# Patient Record
Sex: Male | Born: 1985 | Race: White | Hispanic: No | Marital: Married | Smoking: Never smoker
Health system: Southern US, Community
[De-identification: ages and names within clinical notes are randomized; demographics above are authoritative.]

## PROBLEM LIST (undated history)

## (undated) HISTORY — PX: NO PAST SURGERIES: SHX2092

---

## 2018-03-10 ENCOUNTER — Encounter: Payer: Self-pay | Admitting: Emergency Medicine

## 2018-03-10 ENCOUNTER — Emergency Department

## 2018-03-10 ENCOUNTER — Emergency Department
Admission: EM | Admit: 2018-03-10 | Discharge: 2018-03-10 | Disposition: A | Discharge status: Home / Self Care | Attending: Emergency Medicine | Admitting: Emergency Medicine

## 2018-03-10 ENCOUNTER — Other Ambulatory Visit: Payer: Self-pay

## 2018-03-10 DIAGNOSIS — Y929 Unspecified place or not applicable: Secondary | ICD-10-CM | POA: Diagnosis not present

## 2018-03-10 DIAGNOSIS — Y9367 Activity, basketball: Secondary | ICD-10-CM | POA: Insufficient documentation

## 2018-03-10 DIAGNOSIS — S0990XA Unspecified injury of head, initial encounter: Secondary | ICD-10-CM | POA: Diagnosis present

## 2018-03-10 DIAGNOSIS — S59911A Unspecified injury of right forearm, initial encounter: Secondary | ICD-10-CM | POA: Insufficient documentation

## 2018-03-10 DIAGNOSIS — Y999 Unspecified external cause status: Secondary | ICD-10-CM | POA: Insufficient documentation

## 2018-03-10 DIAGNOSIS — S02612A Fracture of condylar process of left mandible, initial encounter for closed fracture: Secondary | ICD-10-CM | POA: Diagnosis not present

## 2018-03-10 DIAGNOSIS — W01198A Fall on same level from slipping, tripping and stumbling with subsequent striking against other object, initial encounter: Secondary | ICD-10-CM | POA: Insufficient documentation

## 2018-03-10 DIAGNOSIS — S0181XA Laceration without foreign body of other part of head, initial encounter: Secondary | ICD-10-CM | POA: Diagnosis not present

## 2018-03-10 DIAGNOSIS — S0219XA Other fracture of base of skull, initial encounter for closed fracture: Secondary | ICD-10-CM | POA: Diagnosis not present

## 2018-03-10 DIAGNOSIS — S02610A Fracture of condylar process of mandible, unspecified side, initial encounter for closed fracture: Secondary | ICD-10-CM

## 2018-03-10 DIAGNOSIS — W19XXXA Unspecified fall, initial encounter: Secondary | ICD-10-CM

## 2018-03-10 DIAGNOSIS — S59919A Unspecified injury of unspecified forearm, initial encounter: Secondary | ICD-10-CM

## 2018-03-10 MED ORDER — OXYCODONE-ACETAMINOPHEN 7.5-325 MG PO TABS: 1.0000 | ORAL_TABLET | ORAL | 0 refills | Status: AC | PRN

## 2018-03-10 MED ORDER — CIPROFLOXACIN-DEXAMETHASONE 0.3-0.1 % OT SUSP: 4.0000 [drp] | Freq: Two times a day (BID) | OTIC | 0 refills | Status: DC

## 2018-03-10 MED ORDER — CIPROFLOXACIN-DEXAMETHASONE 0.3-0.1 % OT SUSP
4.0000 [drp] | Freq: Once | OTIC | Status: DC
  Filled 2018-03-10: qty 7.5

## 2018-03-10 MED ORDER — LIDOCAINE-EPINEPHRINE 1 %-1:100000 IJ SOLN
10.0000 mL | Freq: Once | INTRAMUSCULAR | Status: AC
  Administered 2018-03-10: 10 mL via INTRADERMAL
  Filled 2018-03-10 (×2): qty 10

## 2018-03-10 MED ORDER — ONDANSETRON HCL 4 MG/2ML IJ SOLN
4.0000 mg | Freq: Once | INTRAMUSCULAR | Status: AC
  Administered 2018-03-10: 4 mg via INTRAVENOUS
  Filled 2018-03-10: qty 2

## 2018-03-10 MED ORDER — HYDROMORPHONE HCL 1 MG/ML IJ SOLN
0.5000 mg | Freq: Once | INTRAMUSCULAR | Status: AC
  Administered 2018-03-10: 0.5 mg via INTRAVENOUS
  Filled 2018-03-10: qty 1

## 2018-03-10 MED ORDER — SODIUM CHLORIDE 0.9 % IV SOLN
Freq: Once | INTRAVENOUS | Status: AC
  Administered 2018-03-10: 13:00:00 via INTRAVENOUS

## 2018-03-10 MED ORDER — MORPHINE SULFATE (PF) 4 MG/ML IV SOLN
4.0000 mg | Freq: Once | INTRAVENOUS | Status: AC
  Administered 2018-03-10: 4 mg via INTRAVENOUS
  Filled 2018-03-10: qty 1

## 2018-03-10 NOTE — ED Provider Notes (Signed)
[facility] Emergency Department Provider Note       Time seen: ----------------------------------------- 11:44 AM on 03/10/2018 -----------------------------------------   I have reviewed the triage vital signs and the nursing notes.  HISTORY   Chief Complaint No chief complaint on file.    HPI Timothy Kelley is a 32 y.o. male with no significant past medical history who presents to the ED for a fall.  Patient states he was playing basketball and he fell hitting his chin and left side of his face.  According to EMS he had a large quantity of blood coming from his left ear which has slowed down now.  Is also complaining of right wrist pain and chin pain with a laceration on his chin.  He denies loss of consciousness or other complaints at this time.  He denies any decreased hearing in his left ear.  No past medical history on file.  There are no active problems to display for this patient.   Allergies Patient has no allergy information on record.  Social History Social History   Tobacco Use  . Smoking status: Not on file  Substance Use Topics  . Alcohol use: Not on file  . Drug use: Not on file   Review of Systems Constitutional: Negative for fever. ENT: Positive for jaw pain and bleeding from the left ear Cardiovascular: Negative for chest pain. Respiratory: Negative for shortness of breath. Gastrointestinal: Negative for abdominal pain, vomiting and diarrhea. Musculoskeletal: Positive for right forearm and wrist pain Skin: Positive for chin laceration Neurological: Negative for headaches, focal weakness or numbness.  All systems negative/normal/unremarkable except as stated in the HPI  ____________________________________________   PHYSICAL EXAM:  VITAL SIGNS: ED Triage Vitals  Enc Vitals Group     BP      Pulse      Resp      Temp      Temp src      SpO2      Weight      Height      Head Circumference      Peak Flow    Pain Score      Pain Loc      Pain Edu?      Excl. in GC?    Constitutional: Alert and oriented.  Mild distress Eyes: Conjunctivae are normal. Normal extraocular movements. ENT   Head: Normocephalic, obvious right-sided chin laceration, left mandibular swelling   Nose: No congestion/rhinnorhea.      Ears: Blood visualized in the left ear canal, TM is difficult to visualize at this time   Mouth/Throat: Mucous membranes are moist.  Left-sided mandibular swelling and TMJ tenderness   Neck: No stridor. Cardiovascular: Normal rate, regular rhythm. No murmurs, rubs, or gallops. Respiratory: Normal respiratory effort without tachypnea nor retractions. Breath sounds are clear and equal bilaterally. No wheezes/rales/rhonchi. Musculoskeletal: Tenderness noted over the right forearm Neurologic:  Normal speech and language. No gross focal neurologic deficits are appreciated.  Skin: Diaphoresis is noted, right-sided chin laceration is noted Psychiatric: Mood and affect are normal. Speech and behavior are normal.  ____________________________________________  ED COURSE:  As part of my medical decision making, I reviewed the following data within the electronic MEDICAL RECORD NUMBER History obtained from family if available, nursing notes, old chart and ekg, as well as notes from prior ED visits. Patient presented for a fall, we will assess with  imaging as indicated at this time.   Marland Kitchen.Laceration Repair Date/Time: 03/10/2018 1:52 PM Performed by: Mayford Knife,  Cecille AmsterdamJonathan E, MD Authorized by: Emily FilbertWilliams, Jeniah Kishi E, MD   Consent:    Consent obtained:  Verbal   Consent given by:  Patient   Risks discussed:  Infection, pain, retained foreign body, poor cosmetic result and poor wound healing Anesthesia (see MAR for exact dosages):    Anesthesia method:  Local infiltration   Local anesthetic:  Lidocaine 1% WITH epi Laceration details:    Location:  Face   Face location:  Chin   Length (cm):  3    Depth (mm):  5 Repair type:    Repair type:  Simple Exploration:    Hemostasis achieved with:  Direct pressure   Wound exploration: entire depth of wound probed and visualized     Contaminated: no   Treatment:    Area cleansed with:  Saline   Amount of cleaning:  Extensive   Irrigation solution:  Sterile saline   Visualized foreign bodies/material removed: no   Skin repair:    Repair method:  Sutures   Suture size:  5-0   Suture material:  Nylon   Number of sutures:  6 Approximation:    Approximation:  Close Post-procedure details:    Dressing:  Open (no dressing)   Patient tolerance of procedure:  Tolerated well, no immediate complications   ____________________________________________   RADIOLOGY Images were viewed by me  CT head, maxillofacial, right forearm x-rays IMPRESSION: Normal head CT.  Mildly displaced fracture is seen involving the left mandibular condyle. Mildly displaced fracture is also seen involving the left temporal bone inferior to the left external auditory canal, with displacement of fracture fragment superiorly toward the external auditory canal, with associated hemorrhage. IMPRESSION: No definitive fracture of the elbow although there is suggestion of a nondisplaced hairline fracture of the neck of the radial head. ____________________________________________  DIFFERENTIAL DIAGNOSIS   Contusion, laceration, TM rupture, mandibular fracture, facial fracture  FINAL ASSESSMENT AND PLAN  Fall, head injury, laceration, possible occult radial head fracture, temporal bone fracture, mandibular condyle fracture   Plan: The patient had presented for a fall with head and face injury. Patient's imaging was positive for fractures of the left mandibular condyle and left temporal bone.  He possibly has a radial head fracture and will need repeat imaging in a week.  I will place him in an Ace wrap and sling until follow-up.  Otherwise have discussed with  [facility] here.  He will follow-up on Monday at 815 with ENT and go home with Ciprodex drops.  He is going to see oral maxillofacial surgery in 2 weeks for reevaluation of his mandibular fracture.  He is gone to be on a soft diet with pain medicine as well.  His wound will need suture removal in a week.   Ulice DashJohnathan E Ariam Mol, MD   Note: This note was generated in part or whole with voice recognition software. Voice recognition is usually quite accurate but there are transcription errors that can and very often do occur. I apologize for any typographical errors that were not detected and corrected.     Emily FilbertWilliams, Seve Monette E, MD 03/10/18 [phone]

## 2018-03-10 NOTE — ED Triage Notes (Addendum)
Pt ems for fall. Pt was playing basketball and fell hitting chin and left side of face. Per ems pt had large quantity of blood from left ear. Bleeding controlled now. Also right wrist pain, chin pain.

## 2018-09-24 IMAGING — CT CT MAXILLOFACIAL W/O CM
4 of 8 series · 16 of 47 positions shown, 18 images · non-contrast
Comparison: None.

CLINICAL DATA: Head laceration after fall playing basketball.

EXAM:
CT HEAD WITHOUT CONTRAST
CT MAXILLOFACIAL WITHOUT CONTRAST
TECHNIQUE: Multidetector CT imaging of the head and maxillofacial structures
were performed using the standard protocol without intravenous
contrast. Multiplanar CT image reconstructions of the maxillofacial
structures were also generated.

[Series 2: head wo · axial · 0.44mm/px · z∈[+100,+200]mm · 6 of 30 slices shown, 8 images]
[im 5/30  brain]
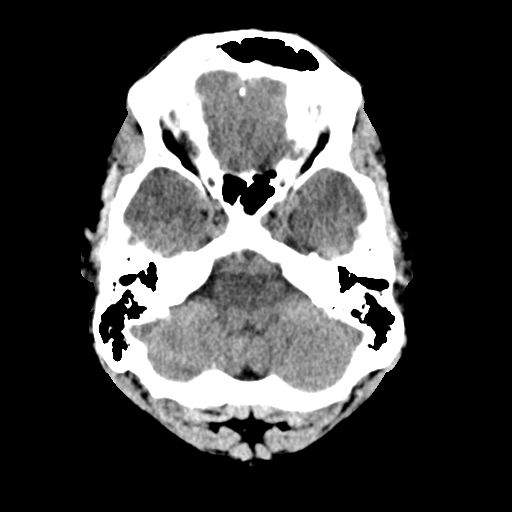
[im 5/30  bone]
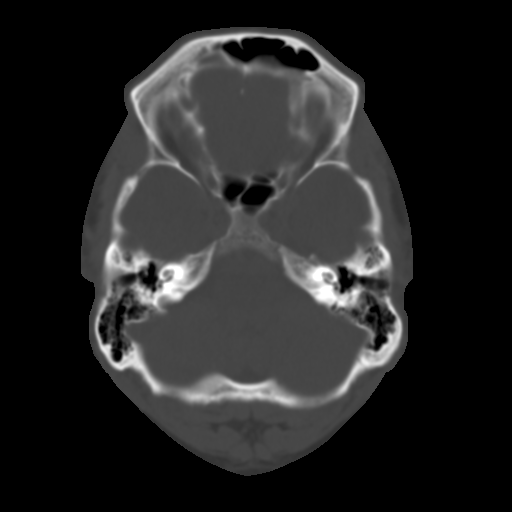
[im 9/30  bone]
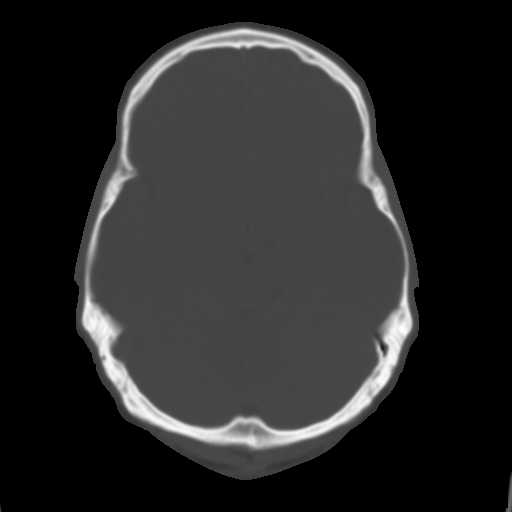
[im 13/30  bone]
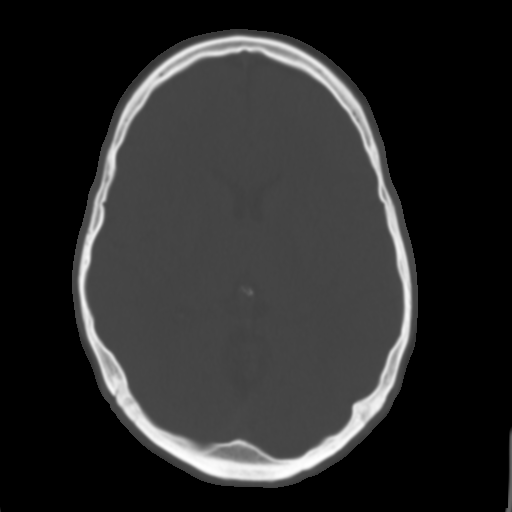
[im 17/30  bone]
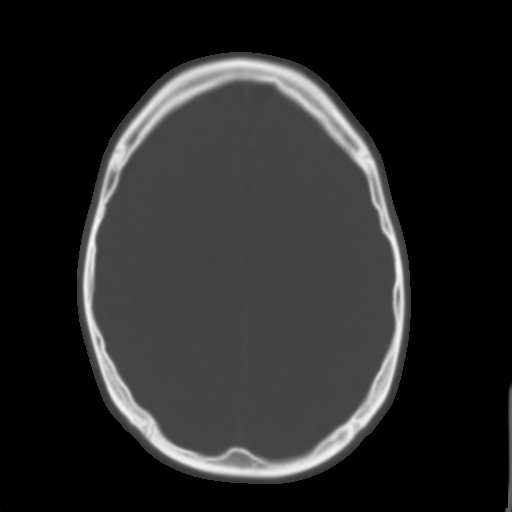
[im 21/30  brain]
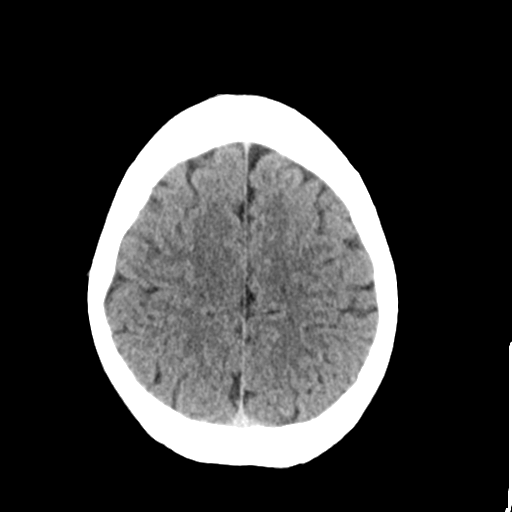
[im 21/30  bone]
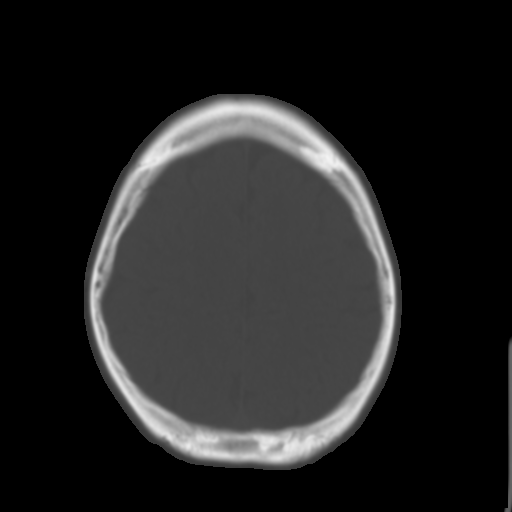
[im 25/30  bone]
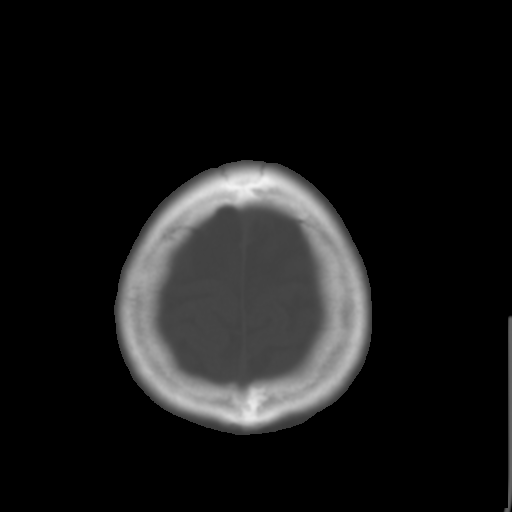

[Series 6: max soft · axial · 0.34mm/px · z∈[-40,+78]mm · 7 of 83 slices shown]
[im 8/83  brain]
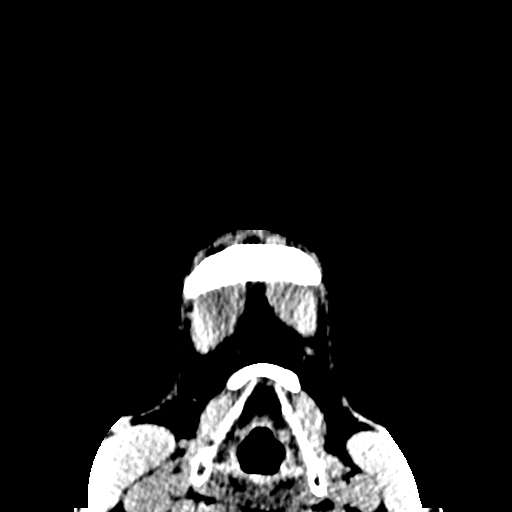
[im 16/83  brain]
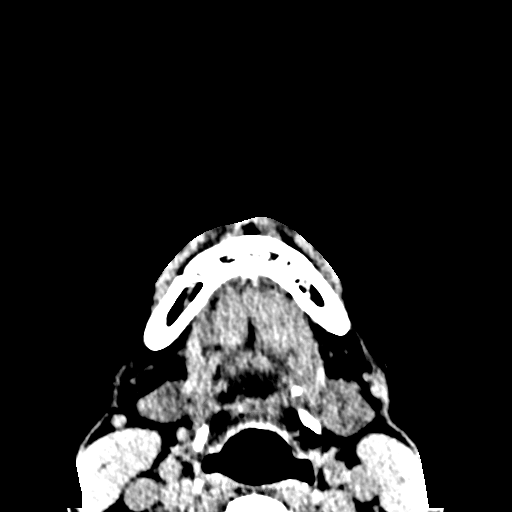
[im 28/83  brain]
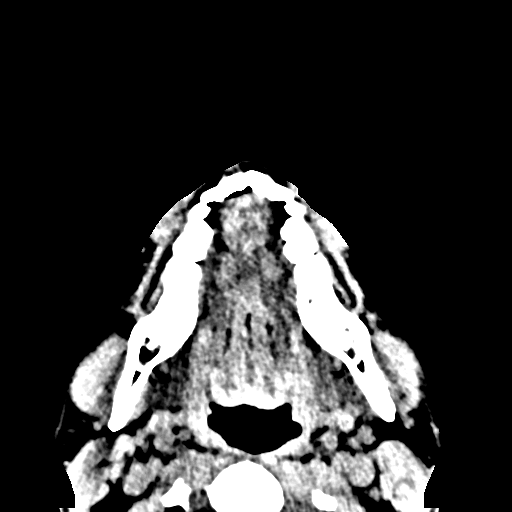
[im 36/83  brain]
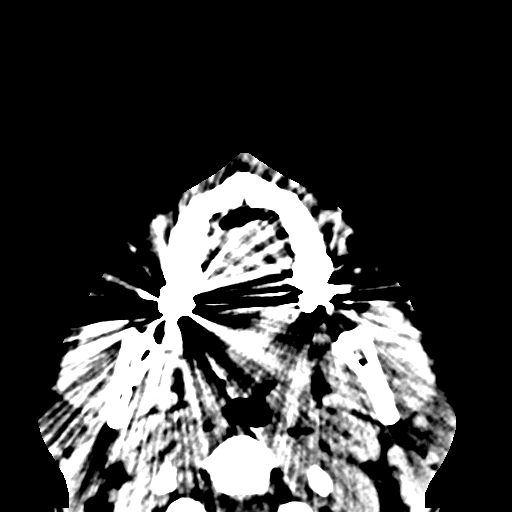
[im 47/83  brain]
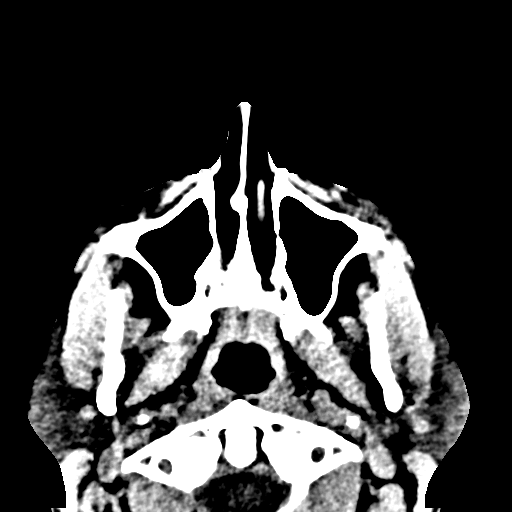
[im 55/83  brain]
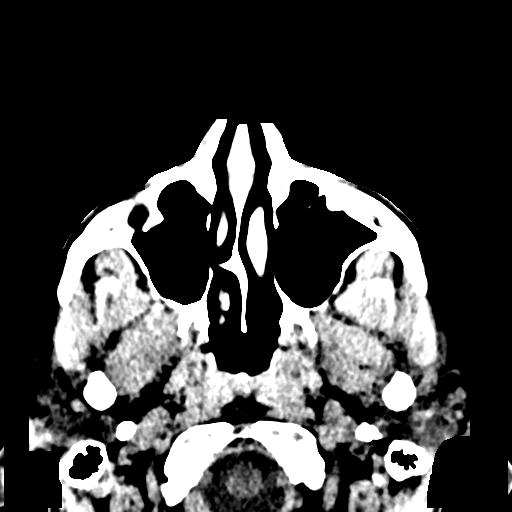
[im 67/83  brain]
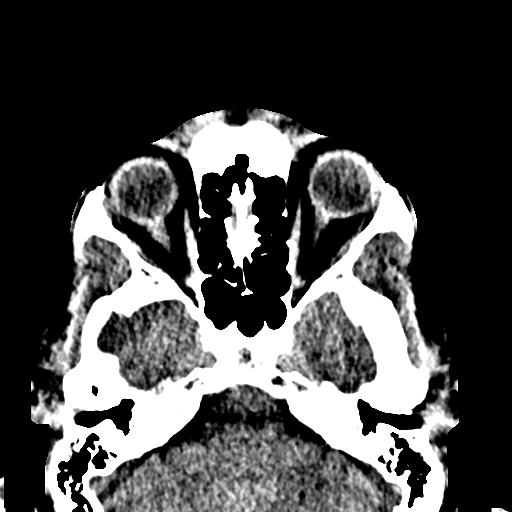

[Series 12: coronal bone · coronal · 0.35mm/px · 2 of 75 slices shown]
[im 25/75  bone]
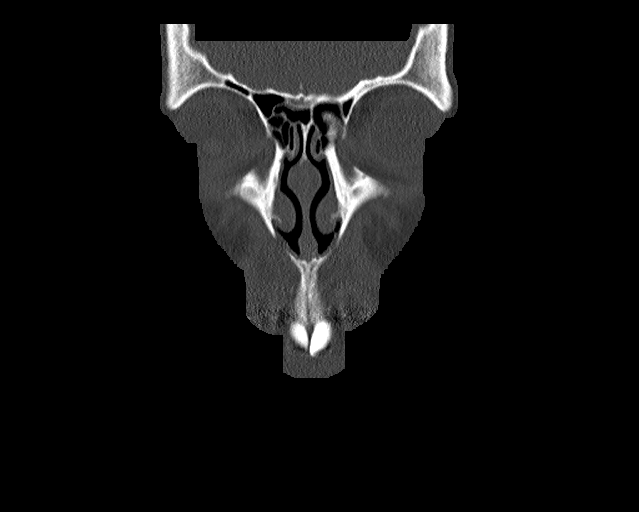
[im 50/75  bone]
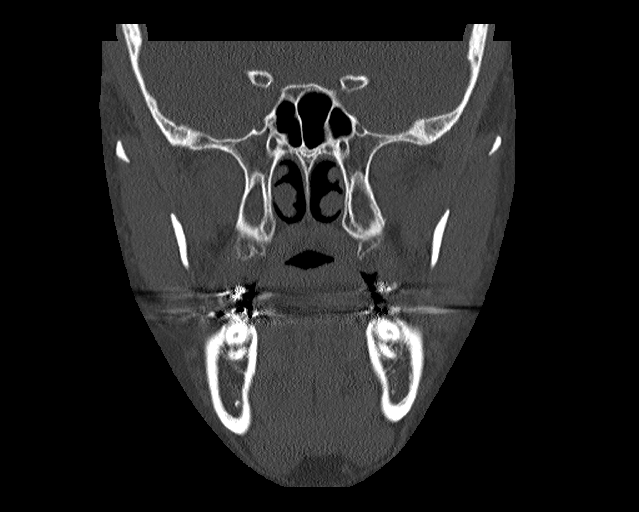

[Series 13: sagittal bone · sagittal · 0.32mm/px · 1 of 88 slices shown]
[im 44/88  bone]
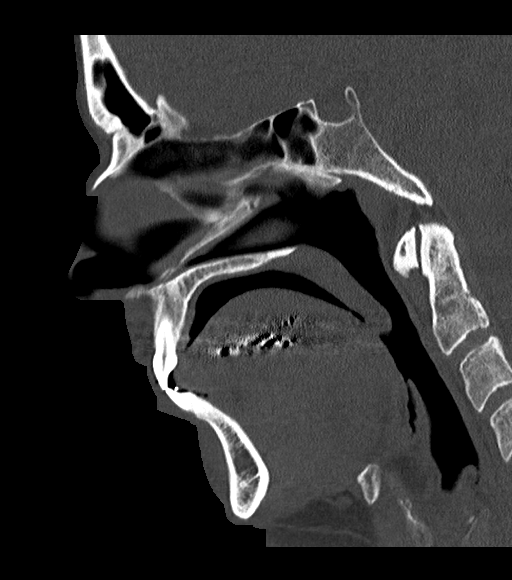

[16 of 47 positions shown; findings below may reference images not displayed]

FINDINGS: CT HEAD FINDINGS

Brain: No evidence of acute infarction, hemorrhage, hydrocephalus,
extra-axial collection or mass lesion/mass effect.

Vascular: No hyperdense vessel or unexpected calcification.

Skull: Normal. Negative for fracture or focal lesion.

Other: None.

CT MAXILLOFACIAL FINDINGS

Osseous: Mildly displaced fracture is seen involving the left
mandibular condyle. There is also noted a mildly displaced fracture
involving the temporal bone inferior to the external auditory canal.
The fracture fragment appears to have been displaced superiorly into
the external auditory canal with probable hemorrhage present.

Orbits: Negative. No traumatic or inflammatory finding.

Sinuses: Clear.

Soft tissues: Negative.
IMPRESSION: Normal head CT.

Mildly displaced fracture is seen involving the left mandibular
condyle. Mildly displaced fracture is also seen involving the left
temporal bone inferior to the left external auditory canal, with
displacement of fracture fragment superiorly toward the external
auditory canal, with associated hemorrhage.

## 2018-09-24 IMAGING — CR DG FOREARM 2V*R*
2 series · 2 of 2 positions shown · non-contrast
Comparison: None.

CLINICAL DATA: Fall with right forearm pain. Initial encounter.

EXAM:
RIGHT FOREARM - 2 VIEW

[forearm ap]
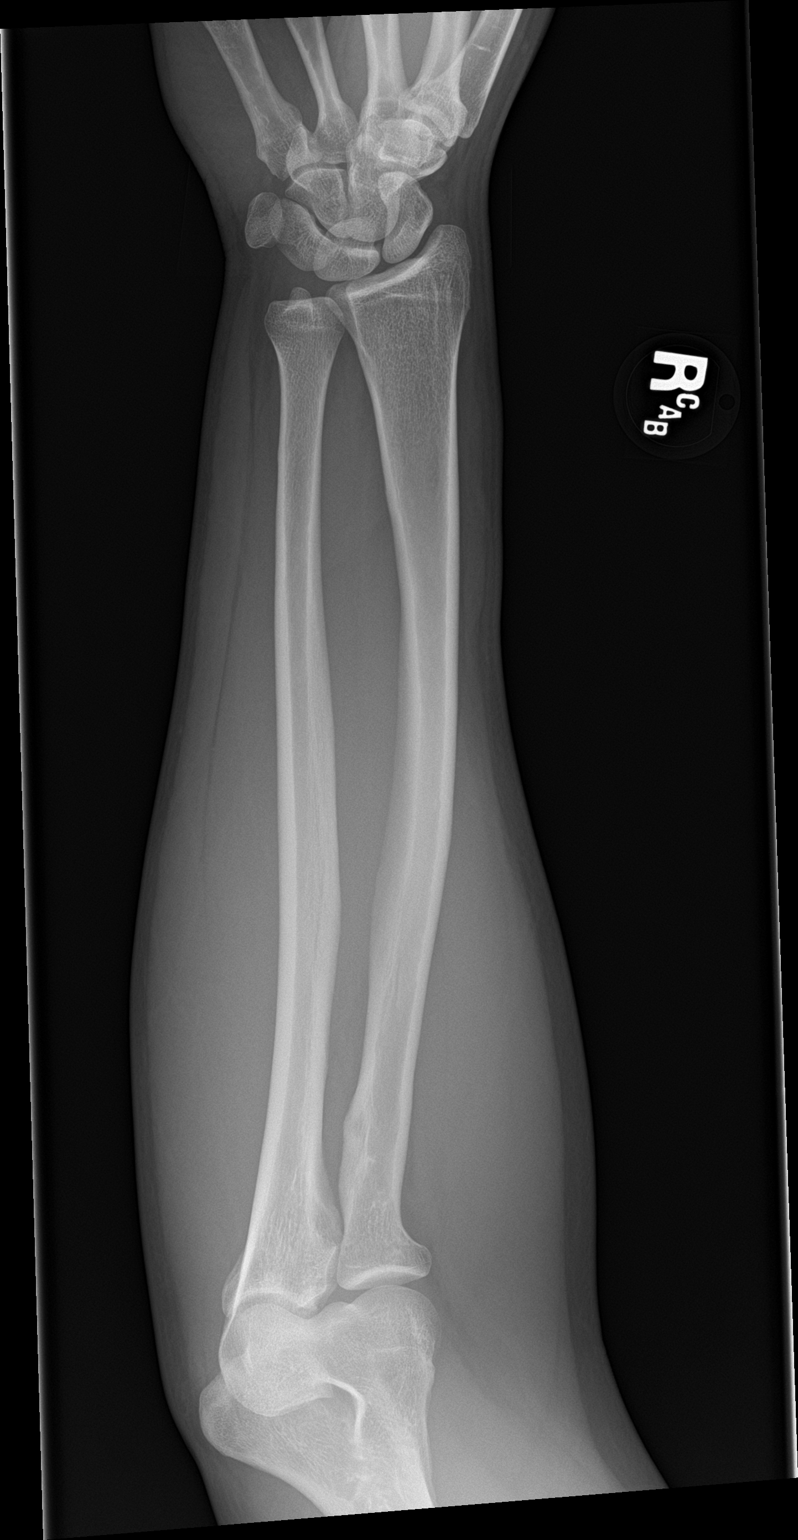

[forearm lat]
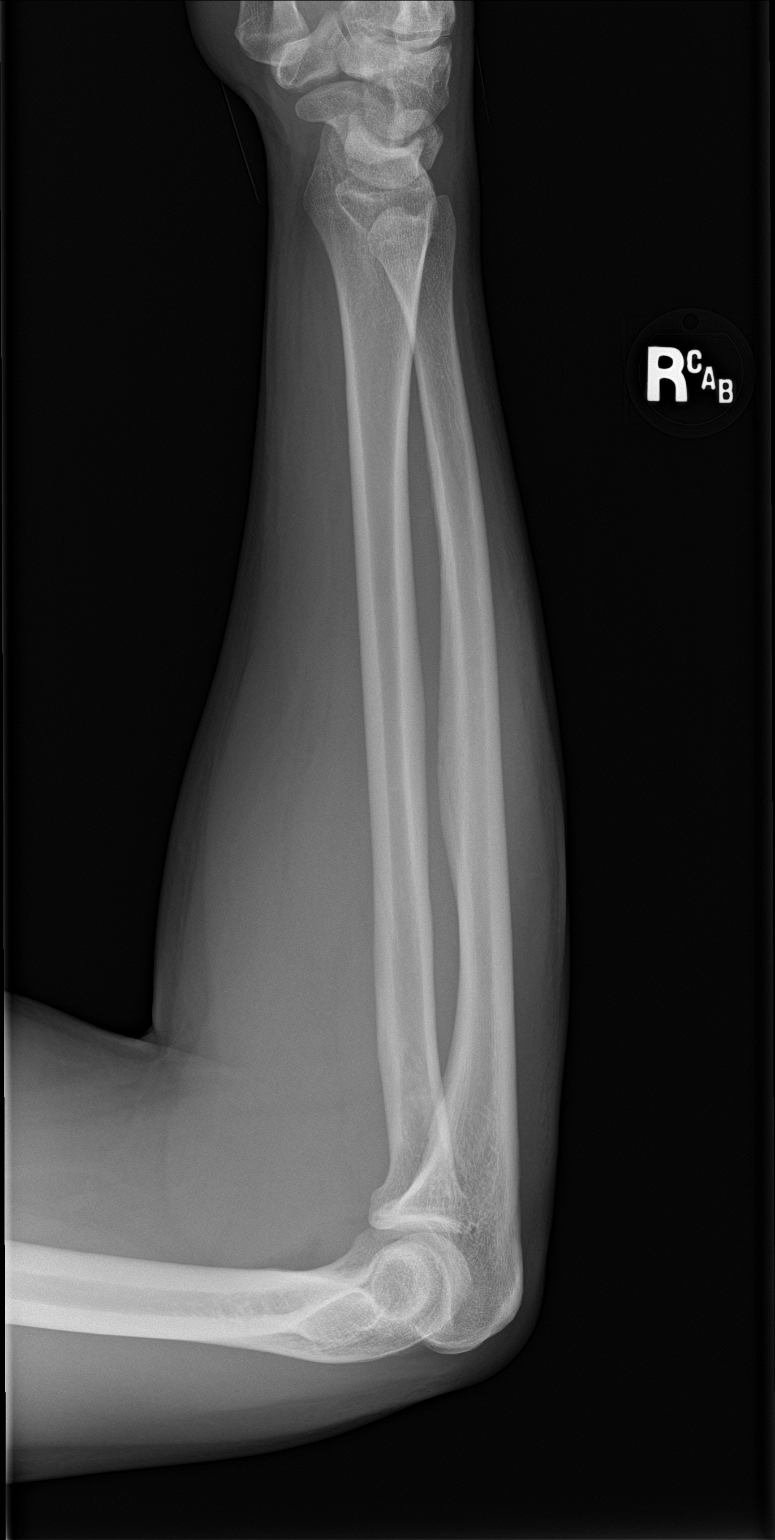

[2 of 2 positions shown; findings below may reference images not displayed]

FINDINGS: There is a degree of bowing of the ventral elbow fat pad. Posterior
fat pad is not seen. There is a subtle lucency at the radial head
marked on the images, not definite for fracture.
IMPRESSION: 1. Prominent ventral elbow fat pad and subtle radial head lucency.
Recommend dedicated elbow series if there are symptoms at the elbow.
2. Otherwise negative forearm series.

## 2020-07-02 ENCOUNTER — Other Ambulatory Visit: Payer: Self-pay

## 2020-07-02 ENCOUNTER — Ambulatory Visit
Admission: EM | Admit: 2020-07-02 | Discharge: 2020-07-02 | Disposition: A | Discharge status: Home / Self Care | Payer: 59 | Attending: Internal Medicine | Admitting: Internal Medicine

## 2020-07-02 DIAGNOSIS — Z20822 Contact with and (suspected) exposure to covid-19: Secondary | ICD-10-CM | POA: Insufficient documentation

## 2020-07-02 DIAGNOSIS — J069 Acute upper respiratory infection, unspecified: Secondary | ICD-10-CM | POA: Insufficient documentation

## 2020-07-02 LAB — SARS CORONAVIRUS 2 (TAT 6-24 HRS): SARS Coronavirus 2: NEGATIVE

## 2020-07-02 NOTE — ED Triage Notes (Addendum)
Patient in today for cough and congestion x 2 days. Patient also would like to be COVID tested. Patient declined vaccine for today but would like information on the vaccine.

## 2020-07-02 NOTE — Discharge Instructions (Addendum)
Please quarantine until COVID-19 test results are available Warm salt water gargle Tylenol as needed for pain and/or fever.

## 2020-07-02 NOTE — ED Provider Notes (Signed)
[facility]    CSN: [id number] Arrival date & time: 07/02/20  1009      History   Chief Complaint Chief Complaint  Patient presents with  . Cough  . Nasal Congestion    HPI Timothy Kelley is a 34 y.o. male comes to urgent care with complaints of cough and nasal congestion of 2 days duration. Patient says symptoms started insidiously and has been persistent. He is requesting Covid testing. No fever, chills, body aches, diarrhea or vomiting. No loss of taste or smell.   HPI  History reviewed. No pertinent past medical history.  There are no problems to display for this patient.   History reviewed. No pertinent surgical history.     Home Medications    Prior to Admission medications   Not on File    Family History History reviewed. No pertinent family history.  Social History Social History   Tobacco Use  . Smoking status: Never Smoker  . Smokeless tobacco: Never Used  Vaping Use  . Vaping Use: Never used  Substance Use Topics  . Alcohol use: Yes  . Drug use: Never     Allergies   Patient has no known allergies.   Review of Systems Review of Systems  Constitutional: Negative for chills, fatigue and fever.  Respiratory: Negative for shortness of breath.   Neurological: Negative for dizziness and headaches.     Physical Exam Triage Vital Signs ED Triage Vitals  Enc Vitals Group     BP 07/02/20 1141 131/71     Pulse Rate 07/02/20 1141 77     Resp 07/02/20 1141 18     Temp 07/02/20 1141 98.2 F (36.8 C)     Temp Source 07/02/20 1141 Oral     SpO2 07/02/20 1141 97 %     Weight 07/02/20 1144 195 lb (88.5 kg)     Height 07/02/20 1144 6\' 2"  (1.88 m)     Head Circumference --      Peak Flow --      Pain Score 07/02/20 1144 0     Pain Loc --      Pain Edu? --      Excl. in GC? --    No data found.  Updated Vital Signs BP 131/71 (BP Location: Right Arm)   Pulse 77   Temp 98.2 F (36.8 C) (Oral)   Resp 18   Ht 6\' 2"  (1.88 m)    Wt 88.5 kg   SpO2 97%   BMI 25.04 kg/m   Visual Acuity Right Eye Distance:   Left Eye Distance:   Bilateral Distance:    Right Eye Near:   Left Eye Near:    Bilateral Near:     Physical Exam Vitals and nursing note reviewed.  Constitutional:      General: He is not in acute distress.    Appearance: He is not ill-appearing.  Cardiovascular:     Rate and Rhythm: Normal rate and regular rhythm.     Pulses: Normal pulses.     Heart sounds: Normal heart sounds.  Pulmonary:     Effort: Pulmonary effort is normal.     Breath sounds: Normal breath sounds.  Neurological:     Mental Status: He is alert.      UC Treatments / Results  Labs (all labs ordered are listed, but only abnormal results are displayed) Labs Reviewed  SARS CORONAVIRUS 2 (TAT 6-24 HRS)    EKG   Radiology No results found.  Procedures Procedures (including critical care time)  Medications Ordered in UC Medications - No data to display  Initial Impression / Assessment and Plan / UC Course  I have reviewed the triage vital signs and the nursing notes.  Pertinent labs & imaging results that were available during my care of the patient were reviewed by me and considered in my medical decision making (see chart for details).     1. Viral URI with cough: COVID-19 PCR test Patient is advised to quarantine until COVID-19 test results are available Hopefully patient will give vaccination further consideration and eventually get vaccinated. Return precautions given. Final Clinical Impressions(s) / UC Diagnoses   Final diagnoses:  Viral URI with cough     Discharge Instructions     Please quarantine until COVID-19 test results are available Warm salt water gargle Tylenol as needed for pain and/or fever.   ED Prescriptions    None     PDMP not reviewed this encounter.   Merrilee Jansky, MD 07/02/20 1739

## 2020-10-26 ENCOUNTER — Other Ambulatory Visit: Payer: Self-pay

## 2020-10-26 ENCOUNTER — Ambulatory Visit
Admission: EM | Admit: 2020-10-26 | Discharge: 2020-10-26 | Disposition: A | Discharge status: Home / Self Care | Payer: 59 | Attending: Physician Assistant | Admitting: Physician Assistant

## 2020-10-26 ENCOUNTER — Telehealth: Payer: Self-pay | Admitting: Emergency Medicine

## 2020-10-26 ENCOUNTER — Encounter: Payer: Self-pay | Admitting: Emergency Medicine

## 2020-10-26 DIAGNOSIS — R5383 Other fatigue: Secondary | ICD-10-CM

## 2020-10-26 DIAGNOSIS — U071 COVID-19: Secondary | ICD-10-CM | POA: Diagnosis not present

## 2020-10-26 DIAGNOSIS — R432 Parageusia: Secondary | ICD-10-CM

## 2020-10-26 DIAGNOSIS — R430 Anosmia: Secondary | ICD-10-CM

## 2020-10-26 LAB — RESP PANEL BY RT-PCR (FLU A&B, COVID) ARPGX2
Influenza A by PCR: NEGATIVE
Influenza B by PCR: NEGATIVE
SARS Coronavirus 2 by RT PCR: POSITIVE — AB

## 2020-10-26 NOTE — ED Triage Notes (Signed)
Patient in today c/o fatigue x 3-4 days and loss of taste/smell x 1 day. Patient denies fever.  Patient states a lot of his co-workers have had covid. Patient has not had the covid vaccines. Patient has taken Tylenol.

## 2020-10-26 NOTE — ED Provider Notes (Signed)
[facility]    CSN: [id number] Arrival date & time: 10/26/20  0900      History   Chief Complaint Chief Complaint  Patient presents with  . Fatigue  . loss of taste  . loss of smell    HPI Timothy Kelley is a 34 y.o. male.   Timothy Kelley presents with complaints of fatigue and feeling run down for the past 2-3 days. He had worked long hours prior to this so initially attributed symptoms to this. Noted yesterday he had a loss of smell and taste. Occasional cough. No fevers. No headache, body aches, nasal congestion, sore throat or gi symptoms. Had coworkers with covid-19. No history of covid-19 and has not received vaccination.   ROS per HPI, negative if not otherwise mentioned.      History reviewed. No pertinent past medical history.  There are no problems to display for this patient.   Past Surgical History:  Procedure Laterality Date  . NO PAST SURGERIES         Home Medications    Prior to Admission medications   Not on File    Family History Family History  Problem Relation Age of Onset  . Hypertension Mother   . Hypertension Father     Social History Social History   Tobacco Use  . Smoking status: Never Smoker  . Smokeless tobacco: Never Used  Vaping Use  . Vaping Use: Never used  Substance Use Topics  . Alcohol use: Yes    Comment: social  . Drug use: Never     Allergies   Patient has no known allergies.   Review of Systems Review of Systems   Physical Exam Triage Vital Signs ED Triage Vitals [10/26/20 0932]  Enc Vitals Group     BP 129/88     Pulse Rate 97     Resp 18     Temp 98.2 F (36.8 C)     Temp Source Oral     SpO2 99 %     Weight 200 lb (90.7 kg)     Height 6\' 3"  (1.905 m)     Head Circumference      Peak Flow      Pain Score 0     Pain Loc      Pain Edu?      Excl. in GC?    No data found.  Updated Vital Signs BP 129/88 (BP Location: Left Arm)   Pulse 97   Temp 98.2 F (36.8 C)  (Oral)   Resp 18   Ht 6\' 3"  (1.905 m)   Wt 200 lb (90.7 kg)   SpO2 99%   BMI 25.00 kg/m    Physical Exam Constitutional:      Appearance: He is well-developed.  Cardiovascular:     Rate and Rhythm: Normal rate.  Pulmonary:     Effort: Pulmonary effort is normal.  Skin:    General: Skin is warm and dry.  Neurological:     Mental Status: He is alert and oriented to person, place, and time.      UC Treatments / Results  Labs (all labs ordered are listed, but only abnormal results are displayed) Labs Reviewed  RESP PANEL BY RT-PCR (FLU A&B, COVID) [id number]    EKG   Radiology No results found.  Procedures Procedures (including critical care time)  Medications Ordered in UC Medications - No data to display  Initial Impression / Assessment and Plan / UC Course  I  have reviewed the triage vital signs and the nursing notes.  Pertinent labs & imaging results that were available during my care of the patient were reviewed by me and considered in my medical decision making (see chart for details).  Clinical Course as of 10/26/20 1411  Sun Oct 26, 2020  1411 SARS Coronavirus 2 by RT PCR(!): POSITIVE [NB]    Clinical Course User Index [NB] Georgetta Haber, NP    Non toxic. Benign physical exam.  Concern for covid-19 given likely exposure and loss of senses. Isolation encouraged and discussed with testing pending. Return precautions provided. Supportive cares recommended.  Patient verbalized understanding and agreeable to plan.   Final Clinical Impressions(s) / UC Diagnoses   Final diagnoses:  Fatigue, unspecified type  Loss of taste  Loss of smell     Discharge Instructions     Self isolate until covid results are back.  Will notify you by phone of any positive findings. Your negative results will be sent through your MyChart.    Push fluids to ensure adequate hydration and keep secretions thin.  Tylenol and/or ibuprofen as needed for pain or fevers.  You may  try some vitamins to help your immune system potentially:  Vitamin C 500mg  twice a day. Zinc 50mg  daily. Vitamin D 5000IU daily.     ED Prescriptions    None     PDMP not reviewed this encounter.   , NP 10/26/20 1024

## 2020-10-26 NOTE — Discharge Instructions (Signed)
Self isolate until covid results are back.  Will notify you by phone of any positive findings. Your negative results will be sent through your MyChart.    Push fluids to ensure adequate hydration and keep secretions thin.  Tylenol and/or ibuprofen as needed for pain or fevers.  You may try some vitamins to help your immune system potentially:  Vitamin C 500mg  twice a day. Zinc 50mg  daily. Vitamin D 5000IU daily.

## 2020-10-26 NOTE — Telephone Encounter (Signed)
Linus Mako, NP sent a message to patient with his covid results to patient's MyChart.

## 2020-10-27 ENCOUNTER — Encounter (HOSPITAL_COMMUNITY): Payer: Self-pay | Admitting: *Deleted

## 2020-10-27 ENCOUNTER — Telehealth (HOSPITAL_COMMUNITY): Payer: Self-pay | Admitting: *Deleted

## 2020-10-27 NOTE — Telephone Encounter (Signed)
Called to Discuss with patient about Covid symptoms and the use of the monoclonal antibody infusion for those with mild to moderate Covid symptoms and at a high risk of hospitalization.     Pt appears to qualify for this infusion due to co-morbid conditions and/or a member of an at-risk group in accordance with the FDA Emergency Use Authorization.    Unable to reach pt
